# Patient Record
Sex: Male | Born: 1980 | Hispanic: No | Marital: Single | Smoking: Never smoker
Health system: Southern US, Community
[De-identification: ages and names within clinical notes are randomized; demographics above are authoritative.]

## PROBLEM LIST (undated history)

## (undated) HISTORY — PX: FRACTURE SURGERY: SHX138

---

## 2002-02-24 ENCOUNTER — Emergency Department (HOSPITAL_COMMUNITY): Admission: EM | Admit: 2002-02-24 | Discharge: 2002-02-24 | Payer: Self-pay | Admitting: Emergency Medicine

## 2006-02-05 ENCOUNTER — Emergency Department (HOSPITAL_COMMUNITY): Admission: EM | Admit: 2006-02-05 | Discharge: 2006-02-05 | Payer: Self-pay | Admitting: Emergency Medicine

## 2007-10-14 ENCOUNTER — Emergency Department (HOSPITAL_COMMUNITY): Admission: EM | Admit: 2007-10-14 | Discharge: 2007-10-14 | Payer: Self-pay | Admitting: Emergency Medicine

## 2008-03-18 ENCOUNTER — Emergency Department (HOSPITAL_COMMUNITY): Admission: EM | Admit: 2008-03-18 | Discharge: 2008-03-18 | Payer: Self-pay | Admitting: Emergency Medicine

## 2010-01-27 IMAGING — CT CT HEAD W/O CM
3 series · 16 of 47 positions shown, 19 images · IV contrast (agent unspecified)
Comparison: None
COMPARISON: None

CLINICAL DATA: Facial numbness.  Headache.  Left facial pain and
swelling.  Fever.

CT HEAD WITHOUT CONTRAST
TECHNIQUE: Contiguous axial images were obtained from the base of
the skull through the vertex without intravenous contrast.
CLINICAL DATA: Facial pain and swelling.
CT MAXILLOFACIAL WITH CONTRAST
TECHNIQUE: Multidetector CT imaging of the maxillofacial structure
s was performed with
intravenous contrast. Multiplanar CT image reconstructions were als
o generated.
Contrast:  100 ml [id number]

[Series 3: headseq 4.8 h45s · axial · 0.43mm/px · z∈[-177,-56]mm · 10 of 30 slices shown, 13 images]
[im 3/30  brain]
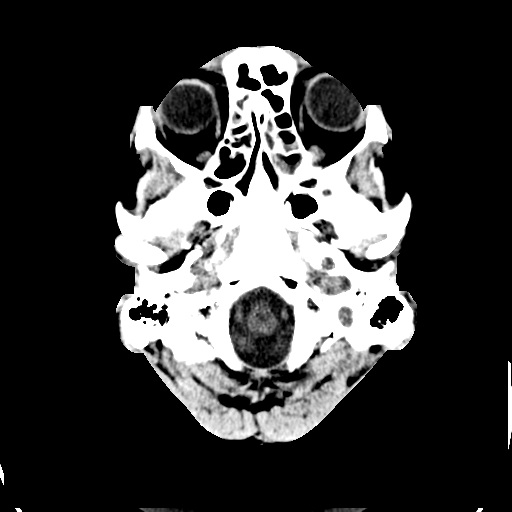
[im 3/30  bone]
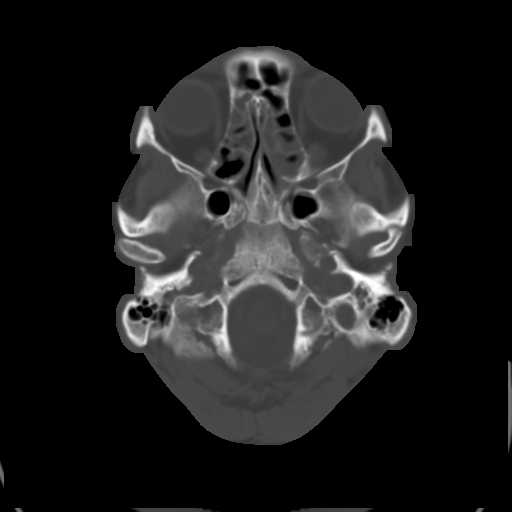
[im 6/30  brain]
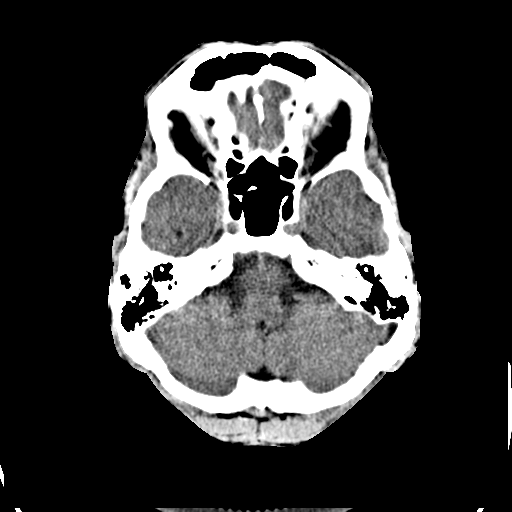
[im 9/30  brain]
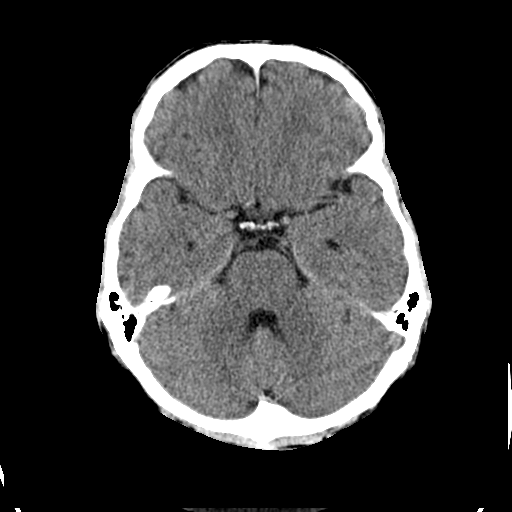
[im 11/30  brain]
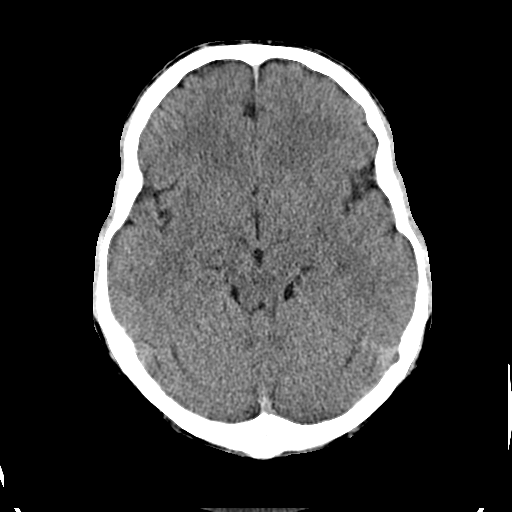
[im 14/30  brain]
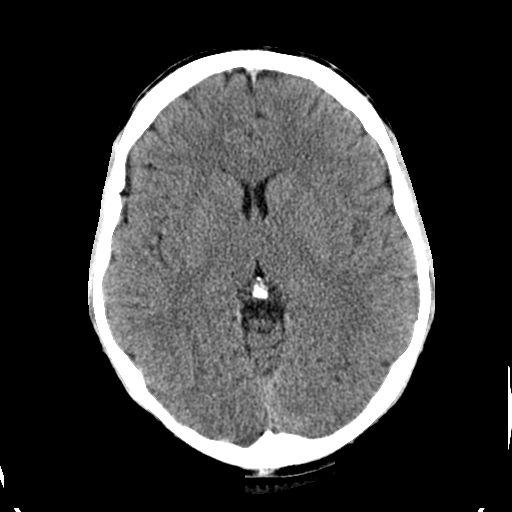
[im 14/30  bone]
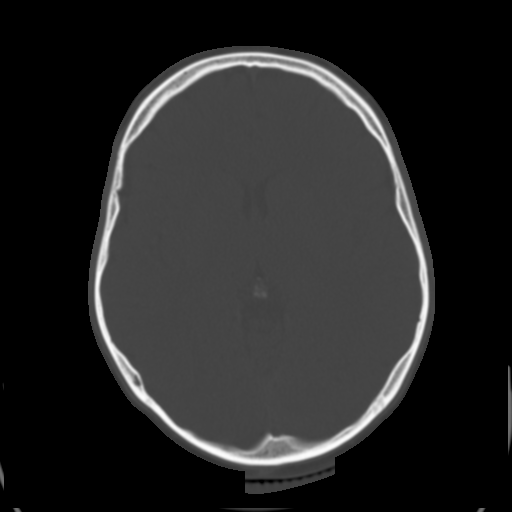
[im 17/30  brain]
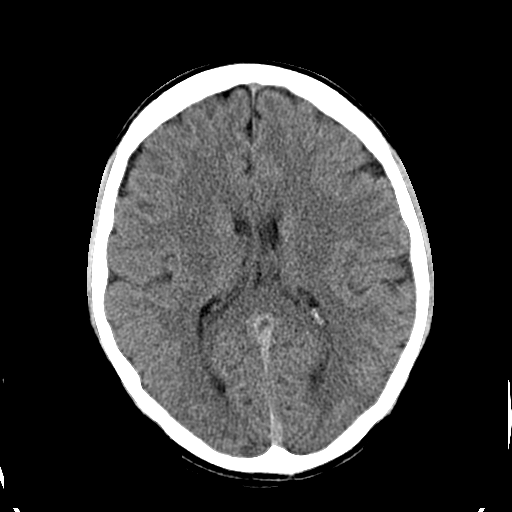
[im 20/30  brain]
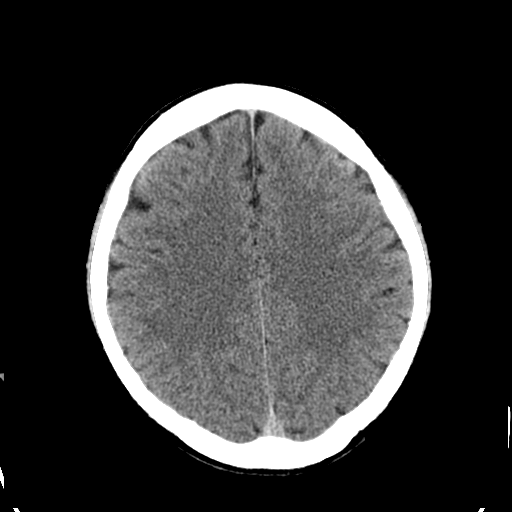
[im 23/30  brain]
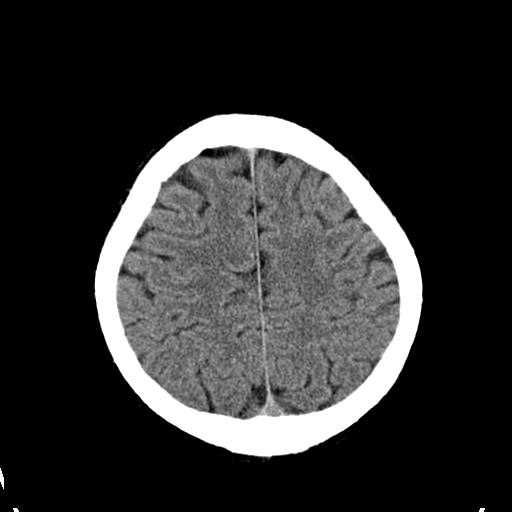
[im 25/30  brain]
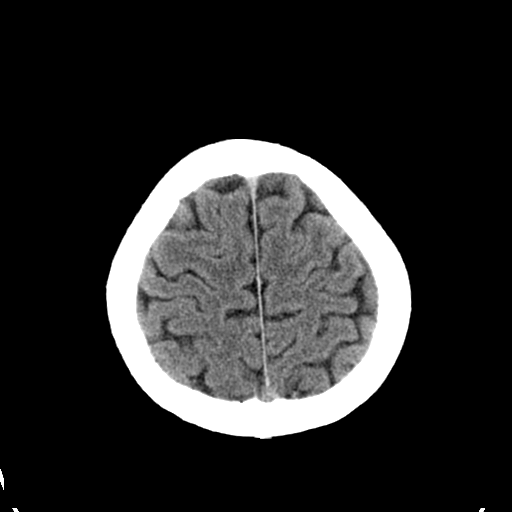
[im 25/30  bone]
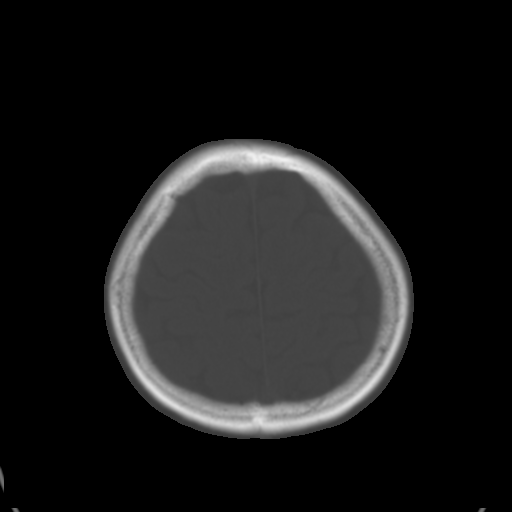
[im 28/30  brain]
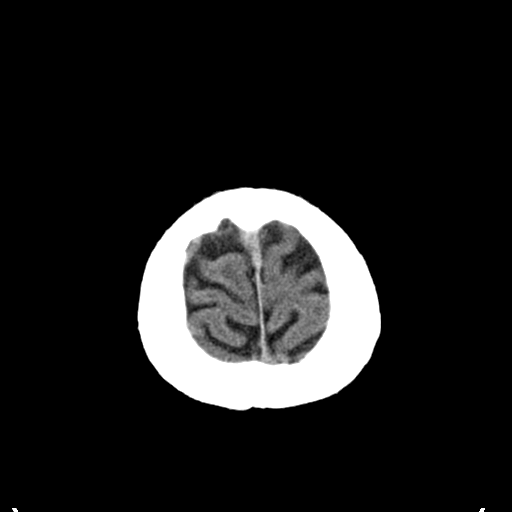

[Series 602: coronal images · coronal · 0.35mm/px · 3 of 70 slices shown]
[im 24/70  brain]
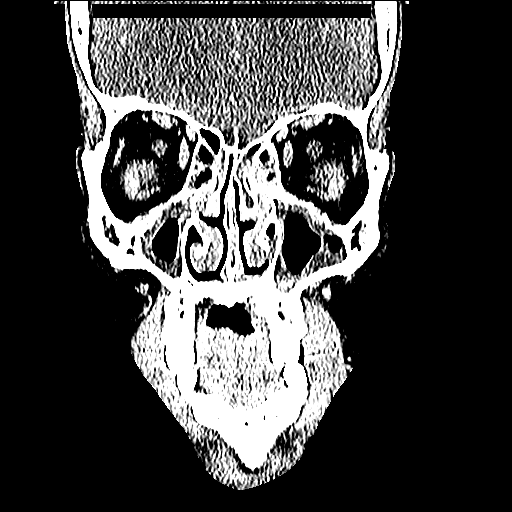
[im 31/70  brain]
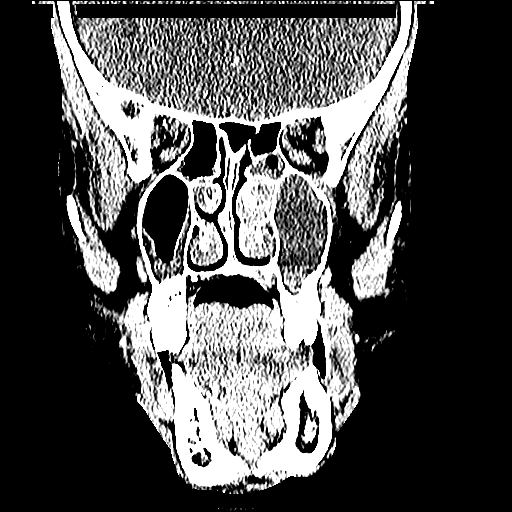
[im 39/70  brain]
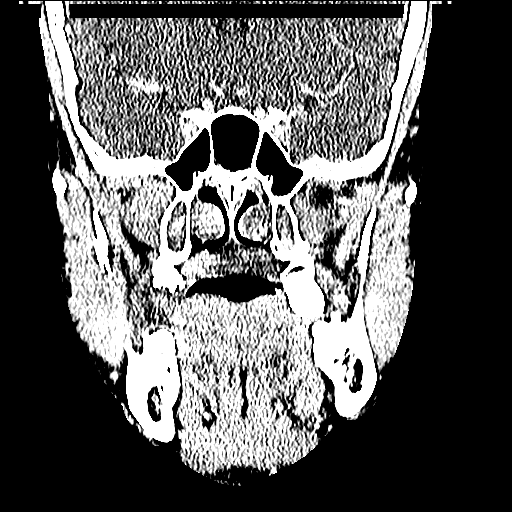

[Series 603: sagittal images · sagittal · 0.35mm/px · 3 of 76 slices shown]
[im 26/76  brain]
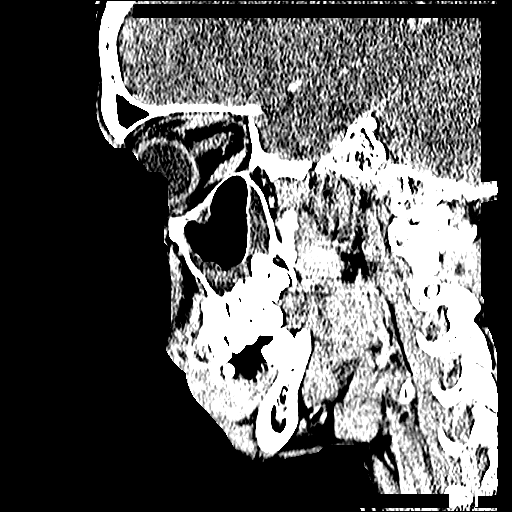
[im 38/76  brain]
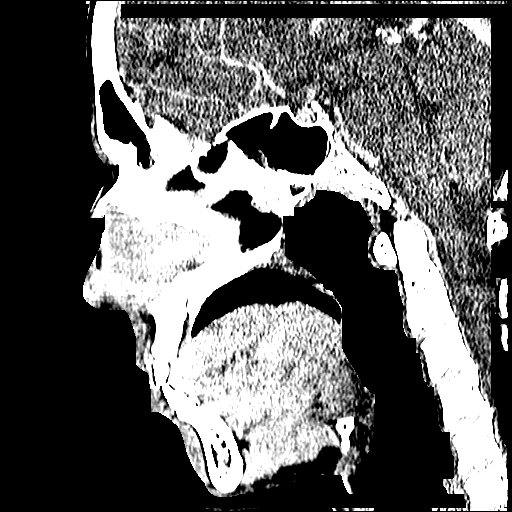
[im 51/76  brain]
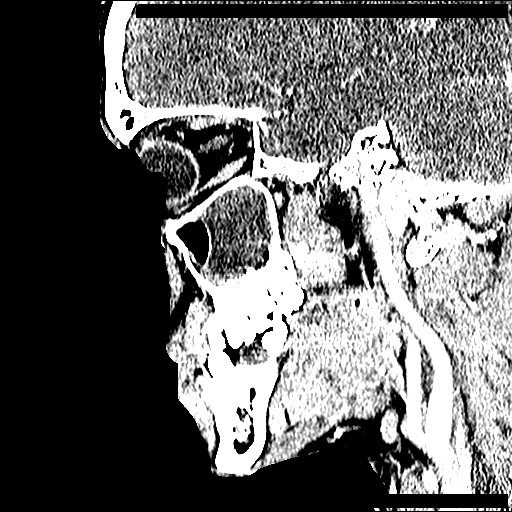

[16 of 47 positions shown; findings below may reference images not displayed]

FINDINGS: The ventricles are normal.  No extra-axial fluid
collections are seen.  The brainstem and cerebellum are
unremarkable.  No acute intracranial findings or mass lesions.

The bony calvarium is intact.  Ethmoid, maxillary and sphenoid
sinus disease.  The mastoid air cells and frontal sinuses are
clear.
IMPRESSION: No acute intracranial findings.
Bilateral sinus disease.
FINDINGS: There is a small amount of mucoperiosteal thickening and
fluid in the right frontal sinus.  There is bilateral ethmoid
sinus disease with areas of complete opacification and
mucoperiosteal thickening.  There is a small amount of fluid in the
left half of the sphenoid sinus along with areas of mucoperiosteal
thickening.  There is bilateral acute maxillary sinusitis with near
complete opacification and air fluid levels.  The mastoid air cells
and middle ear cavities are clear.

No worrisome areas of intracranial enhancement are seen.  There is
mild soft tissue thickening over the frontal scalp region.  No soft
tissue abscess.  The facial bones are intact.  The mandibular
condyles are normally located.  Scattered neck nodes but no
adenopathy.  The parotid and submandibular glands appear normal.
IMPRESSION: Pansinus disease.  The maxillary sinuses are most affected.

## 2010-06-06 LAB — URINALYSIS, ROUTINE W REFLEX MICROSCOPIC
Glucose, UA: NEGATIVE mg/dL
Hgb urine dipstick: NEGATIVE
Ketones, ur: NEGATIVE mg/dL
Protein, ur: NEGATIVE mg/dL
Specific Gravity, Urine: 1.015 (ref 1.005–1.030)

## 2010-06-06 LAB — DIFFERENTIAL
Basophils Absolute: 0 10*3/uL (ref 0.0–0.1)
Basophils Relative: 0 % (ref 0–1)
Lymphocytes Relative: 14 % (ref 12–46)
Lymphs Abs: 1.6 10*3/uL (ref 0.7–4.0)
Neutro Abs: 9.2 10*3/uL — ABNORMAL HIGH (ref 1.7–7.7)

## 2010-06-06 LAB — URINE CULTURE
Colony Count: NO GROWTH
Culture: NO GROWTH

## 2010-06-06 LAB — CBC
MCHC: 33.7 g/dL (ref 30.0–36.0)
WBC: 12 10*3/uL — ABNORMAL HIGH (ref 4.0–10.5)

## 2010-06-06 LAB — POCT I-STAT, CHEM 8
BUN: 7 mg/dL (ref 6–23)
Chloride: 104 mEq/L (ref 96–112)
Creatinine, Ser: 1 mg/dL (ref 0.4–1.5)
HCT: 40 % (ref 39.0–52.0)
Potassium: 3.5 mEq/L (ref 3.5–5.1)

## 2015-03-23 ENCOUNTER — Emergency Department (HOSPITAL_COMMUNITY)
Admission: EM | Admit: 2015-03-23 | Discharge: 2015-03-23 | Disposition: A | Discharge status: Home / Self Care | Payer: Self-pay | Attending: Emergency Medicine | Admitting: Emergency Medicine

## 2015-03-23 ENCOUNTER — Encounter (HOSPITAL_COMMUNITY): Payer: Self-pay | Admitting: Emergency Medicine

## 2015-03-23 ENCOUNTER — Emergency Department (HOSPITAL_COMMUNITY): Payer: Medicaid Other

## 2015-03-23 DIAGNOSIS — Z9889 Other specified postprocedural states: Secondary | ICD-10-CM | POA: Insufficient documentation

## 2015-03-23 DIAGNOSIS — M79631 Pain in right forearm: Secondary | ICD-10-CM | POA: Insufficient documentation

## 2015-03-23 DIAGNOSIS — M25521 Pain in right elbow: Secondary | ICD-10-CM | POA: Insufficient documentation

## 2015-03-23 DIAGNOSIS — Z8781 Personal history of (healed) traumatic fracture: Secondary | ICD-10-CM | POA: Insufficient documentation

## 2015-03-23 MED ORDER — HYDROCODONE-ACETAMINOPHEN 5-325 MG PO TABS: 2.0000 | ORAL_TABLET | ORAL | Status: DC | PRN

## 2015-03-23 MED ORDER — IBUPROFEN 400 MG PO TABS
800.0000 mg | ORAL_TABLET | Freq: Once | ORAL | Status: AC
  Administered 2015-03-23: 800 mg via ORAL
  Filled 2015-03-23: qty 2

## 2015-03-23 NOTE — ED Notes (Signed)
Hx of right radial head sx in 2011 by MD in [facility]. Started 1 week ago with pain right elbow. 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

## 2015-03-23 NOTE — ED Provider Notes (Signed)
History  By signing my name below, I, Karle Plumber, attest that this documentation has been prepared under the direction and in the presence of Melburn Hake, [place]. Electronically Signed: Karle Plumber, ED Scribe. 03/23/2015. 12:02 PM  Chief Complaint  Patient presents with  . Arm Pain   The history is provided by the patient and medical records. No language interpreter was used.    HPI Comments:  Omar Simon is a 35 y.o. male who presents to the Emergency Department complaining of sharp RUE pain that began about one week ago. He states about 5-6 years ago he had surgery to the right radial head by a doctor in [facility]. He states he used a Chiropodist at work and is concerned he may have disrupted some of the hardware in the arm. He states he can feel a "bump" on the right arm near the elbow that is new. Letting the arm hang by his side and moving the right arm increases the pain. Holding the arm in a protective bent state helps to alleviate the pain. He has been taking Tramadol for the pain with some relief. He denies swelling, redness, fever, chills, numbness or weakness of the RUE, right shoulder. He denies any new trauma, injury or fall. He denies allergies to any medications.  History reviewed. No pertinent past medical history. Past Surgical History  Procedure Laterality Date  . Fracture surgery     No family history on file. Social History  Substance Use Topics  . Smoking status: Never Smoker   . Smokeless tobacco: None  . Alcohol Use: No    Review of Systems  Constitutional: Negative for fever and chills.  Musculoskeletal: Positive for myalgias.  Skin: Negative for color change and wound.  Neurological: Negative for weakness and numbness.    Allergies  Review of patient's allergies indicates no known allergies.  Home Medications   Prior to Admission medications   Medication Sig Start Date End Date Taking? Authorizing Provider   HYDROcodone-acetaminophen (NORCO/VICODIN) 5-325 MG tablet Take 2 tablets by mouth every 4 (four) hours as needed. 03/23/15   Barrett Henle, PA-C  traMADol (ULTRAM) 50 MG tablet Take 50 mg by mouth every 6 (six) hours as needed.   Yes Historical Provider, MD   Triage Vitals: BP 126/81 mmHg  Pulse 65  Temp(Src) 98.4 F (36.9 C) (Oral)  Resp 18  SpO2 100% Physical Exam  Constitutional: He is oriented to person, place, and time. He appears well-developed and well-nourished.  HENT:  Head: Normocephalic and atraumatic.  Eyes: EOM are normal.  Neck: Normal range of motion.  Cardiovascular: Normal rate.   2+ radial pulse. Cap refill less than 2 seconds.  Pulmonary/Chest: Effort normal.  Musculoskeletal: Normal range of motion.  Mild tenderness over right lateral epicondyle and radial head. No swelling, erythema or warmth. Full ROM with flexion and extension of right elbow with reported pain. Decreased supination of right forearm which pt reports is unchanged s/p surgery. Full ROM of right hand, wrist and shoulder.  Neurological: He is alert and oriented to person, place, and time.  Sensations intact. Grip strength equal bilaterally.  Skin: Skin is warm and dry.  Psychiatric: He has a normal mood and affect. His behavior is normal.  Nursing note and vitals reviewed.   ED Course  Procedures (including critical care time) DIAGNOSTIC STUDIES: Oxygen Saturation is 100% on RA, normal by my interpretation.   COORDINATION OF CARE: 11:21 AM- Will X-Ray right elbow and order pain medication. Pt  verbalizes understanding and agrees to plan.  Medications  ibuprofen (ADVIL,MOTRIN) tablet 800 mg (800 mg Oral Given 03/23/15 1132)    Labs Review Labs Reviewed - No data to display  Imaging Review Dg Elbow Complete Right  03/23/2015  CLINICAL DATA:  Painful right elbow for 1 week.  Lateral elbow pain. EXAM: RIGHT ELBOW - COMPLETE 3+ VIEW COMPARISON:  None. FINDINGS: Patient is status post  joint replacement involving the radial head. There is lucency around the prosthesis which could reflect loosening. No joint effusion. No fracture, subluxation or dislocation. IMPRESSION: Lucency around the radial head prosthesis could reflect loosening. Electronically Signed   By: Charlett Nose M.D.   On: 03/23/2015 11:58   I have personally reviewed and evaluated these images and lab results as part of my medical decision-making.  Filed Vitals:   03/23/15 1059 03/23/15 1223  BP: 126/81 125/78  Pulse: 65 67  Temp: 98.4 F (36.9 C) 98.6 F (37 C)  Resp: 18 18     MDM   Final diagnoses:  Right elbow pain    Patient presents with right elbow pain. Denies any known fall, trauma, injury but reports using a heavy hammer drill at work this week. Endorses history of surgery to right elbow due to fracture. VSS. Exam revealed mild tenderness over right lateral condyle and radial head, right arm otherwise neurovascularly intact. Right elbow x-ray revealed lucency around radial head prosthesis possibly reflecting loosening. Plan to discharge patient home with pain meds and symptomatic treatment. Patient given information for orthopedic follow-up.  Evaluation does not show pathology requring ongoing emergent intervention or admission. Pt is hemodynamically stable and mentating appropriately. Discussed findings/results and plan with patient/guardian, who agrees with plan. All questions answered. Return precautions discussed and outpatient follow up given.    I personally performed the services described in this documentation, which was scribed in my presence. The recorded information has been reviewed and is accurate.     Satira Sark [place], [place] 03/23/15 1545  Tilden Fossa, MD 03/24/15 [phone]

## 2015-03-23 NOTE — Discharge Instructions (Signed)
Take your medications as prescribed. I also recommend continuing to rest your elbow and applying ice for 15-20 minutes 3-4 times daily as needed for pain relief. Call the orthopedist office listed above to schedule a follow-up appointment. Return to the emergency department if symptoms worsen or new onset of fever, redness, swelling, numbness, tingling, weakness, decreased range of motion.   Emergency Department Resource Guide 1) Find a Doctor and Pay Out of Pocket Although you won't have to find out who is covered by your insurance plan, it is a good idea to ask around and get recommendations. You will then need to call the office and see if the doctor you have chosen will accept you as a new patient and what types of options they offer for patients who are self-pay. Some doctors offer discounts or will set up payment plans for their patients who do not have insurance, but you will need to ask so you aren't surprised when you get to your appointment.  2) Contact Your Local Health Department Not all health departments have doctors that can see patients for sick visits, but many do, so it is worth a call to see if yours does. If you don't know where your local health department is, you can check in your phone book. The CDC also has a tool to help you locate your state's health department, and many state websites also have listings of all of their local health departments.  3) Find a Walk-in Clinic If your illness is not likely to be very severe or complicated, you may want to try a walk in clinic. These are popping up all over the country in pharmacies, drugstores, and shopping centers. They're usually staffed by nurse practitioners or physician assistants that have been trained to treat common illnesses and complaints. They're usually fairly quick and inexpensive. However, if you have serious medical issues or chronic medical problems, these are probably not your best option.  No Primary Care  Doctor: - Call Health Connect at  [phone] - they can help you locate a primary care doctor that  accepts your insurance, provides certain services, etc. - Physician Referral Service- [phone]  Chronic Pain Problems: Organization         Address  Phone   Notes  [facility]  [phone] Patients need to be referred by their primary care doctor.   Medication Assistance: Organization         Address  Phone   Notes  [facility] Medication [facility] [address] [place] [place], [place] [address] [phone] --Must be a resident of [facility] -- Must have NO insurance coverage whatsoever (no Medicaid/ Medicare, etc.) -- The pt. MUST have a primary care doctor that directs their care regularly and follows them in the community   [facility]ist  [phone]   [employer]  [phone]    Agencies that provide inexpensive medical care: Organization         Address  Phone   Notes  [facility]  [phone]   [facility]    [phone]   [facility] [address] [place], [place] [address] [phone]   [facility] [address] [place]. [address], [place] [phone]   Planned Parenthood    [phone]   [facility]    [phone]   Community Health and [facility]  [address] [place], [place] Phone:  [phone])  [id number], Fax:  [phone] Hours of Operation:  9 am - 6 pm, M-F.  Also accepts Medicaid/Medicare and self-pay.  [facility] for [place] [address], [address], [place] Phone: [phone], Fax: [phone]. Hours of Operation:  8:30 am - 5:30 pm, M-F.  Also accepts Medicaid and self-pay.  [facility] [place] [address], [place] Phone: [phone]   [place], [place], [place] [phone], Ext. [phone] Mondays & Thursdays: 7-9 AM.  First 15 patients are seen on a first  come, first serve basis.    [place] Providers:  Organization         Address  Phone   Notes  [facility] [place] [address], [place] [phone] Also accepts self-pay patients.  [facility] [id number] [place], [place]  [phone]   [place], [address], [place] [phone]   [facility] [address], [place] [phone]   Lucianne Lei [address], [place]   [phone] Only accepts [place] Access [place] patients after they have their name applied to their card.   Self-Pay (no insurance) in [facility]:  Organization         Address  Phone   Notes  Sickle Cell Patients, [facility] [place] [phone]   [facility] [place] [phone]   [facility] [place]  [place], [place], [place] [phone]   [facility]/Dr. Osei-Bonsu  [address] [place] or [address], [place] [phone] Phone number for both [place] and [place] locations is the same.  [facility] [place] [address], [place] [phone]   [place] [address], [place] or [address] [phone]   [facility] [address], [place] [phone], phone; [phone], fax Sees patients 1st and 3rd Saturday of every month.  Must not qualify for public or private insurance (i.e. Medicaid, Medicare, [place] Health Choice, Veterans' Benefits)  Household income should be no more than 200% of the poverty level The clinic cannot treat you if you are pregnant or think you are pregnant  Sexually transmitted diseases are not treated at the clinic.    Dental Care: Organization          Address  Phone  Notes  [facility] of [facility] [phone] Accepts children up to age 20 who are enrolled in [place] or [place]; pregnant women with a Medicaid card; and children who have applied for Medicaid or [place] Health Choice, but were declined, whose parents can pay a reduced fee at time of service.  [facility] Department of [facility]  [address], [place] [phone] Accepts children up to age 65 who are enrolled in [place] or [place]; pregnant women with a Medicaid card; and children who have applied for Medicaid or [place] Health Choice, but were declined, whose parents can pay a reduced fee at time of service.  [facility] PRO[facility]  [place] [phone] Patients are seen by appointment only. Walk-ins are  not accepted. [place] will see patients 46 years of age and older. Monday - Tuesday (8am-5pm) Most Wednesdays (8:30-5pm) $30 per visit, cash only  [facility] Adult Dental Access PROGRAM  [address], [facility] [phone] Patients are seen by appointment only. Walk-ins are not accepted. [place] will see patients 60 years of age and older. One Wednesday Evening (Monthly: Volunteer Based).  $30 per visit, cash only  [place]  [phone] for adults; Children under age 9, call Graduate Pediatric Dentistry at [phone]. Children aged 38-14, please call [phone] to request a pediatric application.  Dental services are provided in all areas of dental care including fillings, crowns and bridges, complete and partial dentures, implants, gum treatment, root canals, and extractions. Preventive care is also provided. Treatment is provided to both adults and children. Patients are selected via a lottery and there is often a waiting list.   [facility] [address] [place]  [phone] [email or web address]   [facility] [address] [place], [place] [phone], Ext. [phone] Second and Fourth Thursday of each month, opens at 6:30 AM; Clinic ends at 9 AM.  Patients are seen on a first-come first-served basis, and a limited number are seen during each clinic.   [facility]  [address] [place], [place] [phone]   Eligibility Requirements You must have lived in [place], [place], or [place] counties for at least the last three months.   You cannot be eligible for state or federal sponsored [employer], including [employer], [place], or [employer].   You generally cannot be eligible for healthcare insurance through your employer.    How to apply: Eligibility screenings are held every Tuesday and Wednesday afternoon from 1:00 pm until 4:00 pm. You do not need an appointment for the interview!  [facility] [address], [place], [place]   [place]  [place] Department  [place]  [phone]    Behavioral Health Resources in the Community: Intensive Outpatient Programs Organization         Address  Phone  Notes  [place]. [address] [place], [place] [phone]   [facility] Outpatient [address], [place], [place]   ADS: Alcohol & Drug Svcs [address], [place], [place]   [place] [address],  [place], [place] or [phone]   Substance Abuse Resources Organization         Address  Phone  Notes  Alcohol and Drug Services  [phone]   [place]  [phone]   [facility] [place]   Chinita Pester  ([phone]   Residential & Outpatient Substance Abuse Program  [phone]   Psychological  Services Organization         Address  Phone  Notes  [facility] [place]  [place]  [phone]   [place] [address], [place] [phone] or [phone]    Mobile Crisis Teams Organization         Address  Phone  Notes  [facility], Mobile Crisis Care Unit  [phone]   [facility] Psychotherapeutic Services  [address] [place], [place]   [facility] [address] [place] [phone]    Self-Help/Support  Groups Organization         Address  Phone             Notes  [facility]ental [facility]oc[facility] of [place] - variety of support groups  336- I7437963 Call for more information  Narcotics Anonymous (NA), [facility] [address], [employer] [place]  2 meetings at this location   Statistician         Address  Phone  Notes  [facility] [address],    [place]  [phone]   [facility]  [address], [place] [address], [place], [place] [phone]   [facility] Treatment [facility] [address] [place], [place] [phone] Admissions: 8am-3pm M-F  [facility] [address],    [place], [place] [phone]   [facility] [address] [place], [place], [place] [phone]   [facility] [address],  [place], [place] [phone]   [facility] - Intensive Outpatient [address], [place], [place] [phone]   [facility] [address]  [place], [place] [phone] or [phone]   [facility] ([facility]) [address] [place], [place] [phone] Accepts Medicaid  [facility] [address]  [place] [phone] Substance Abuse/Addiction Treatment   [facility] Organization         Address  Phone  Notes  [facility]  [phone]   Angie Fava, PhD [address] [place]vin Knack [place], [place]   ([phone] or [phone]   [facility]   [address] [place], [place] [phone]   [facility] [address] [place], [place] [phone] Insurance/Medicaid/sponsorship through [facility] and Families [address]                                    [place], [place] [phone] Therapy/tele-psych/case  [facility] [address] [place], [place] [phone]    Dr. Lolly Mustache  [phone]   [facility]  [employer] [facility] Dept. 1) [address] [place] 2) [address], [place] 3)  [address], [place] [phone]  [phone]   [facility] Child Abuse Hotline 915-182-6104 or [phone] (After Hours)

## 2017-12-22 LAB — GLUCOSE, POCT (MANUAL RESULT ENTRY): POC Glucose: 147 mg/dl — AB (ref 70–99)

## 2019-12-21 ENCOUNTER — Other Ambulatory Visit: Payer: Self-pay

## 2019-12-21 ENCOUNTER — Emergency Department (HOSPITAL_COMMUNITY)
Admission: EM | Admit: 2019-12-21 | Discharge: 2019-12-22 | Disposition: A | Discharge status: Home / Self Care | Payer: Self-pay | Attending: Emergency Medicine | Admitting: Emergency Medicine

## 2019-12-21 ENCOUNTER — Encounter (HOSPITAL_COMMUNITY): Payer: Self-pay | Admitting: *Deleted

## 2019-12-21 DIAGNOSIS — T1501XA Foreign body in cornea, right eye, initial encounter: Secondary | ICD-10-CM | POA: Insufficient documentation

## 2019-12-21 DIAGNOSIS — M25521 Pain in right elbow: Secondary | ICD-10-CM | POA: Insufficient documentation

## 2019-12-21 DIAGNOSIS — X58XXXA Exposure to other specified factors, initial encounter: Secondary | ICD-10-CM | POA: Insufficient documentation

## 2019-12-21 DIAGNOSIS — T1591XA Foreign body on external eye, part unspecified, right eye, initial encounter: Secondary | ICD-10-CM

## 2019-12-21 NOTE — ED Triage Notes (Signed)
The pt  Was welding approx 2 hours ago some particle from the drill he was using went into his rt eye,  He also twisted his rt arm with the drill c/o pain

## 2019-12-22 ENCOUNTER — Emergency Department (HOSPITAL_COMMUNITY): Payer: Self-pay

## 2019-12-22 MED ORDER — OXYCODONE-ACETAMINOPHEN 5-325 MG PO TABS
2.0000 | ORAL_TABLET | Freq: Once | ORAL | Status: AC
  Administered 2019-12-22: 2 via ORAL
  Filled 2019-12-22: qty 2

## 2019-12-22 MED ORDER — FLUORESCEIN SODIUM 1 MG OP STRP
1.0000 | ORAL_STRIP | Freq: Once | OPHTHALMIC | Status: AC
  Administered 2019-12-22: 1 via OPHTHALMIC
  Filled 2019-12-22: qty 1

## 2019-12-22 MED ORDER — TETRACAINE HCL 0.5 % OP SOLN
2.0000 [drp] | Freq: Once | OPHTHALMIC | Status: AC
  Administered 2019-12-22: 2 [drp] via OPHTHALMIC
  Filled 2019-12-22: qty 4

## 2019-12-22 MED ORDER — ERYTHROMYCIN 5 MG/GM OP OINT
1.0000 "application " | TOPICAL_OINTMENT | Freq: Once | OPHTHALMIC | Status: AC
  Administered 2019-12-22: 1 via OPHTHALMIC
  Filled 2019-12-22: qty 3.5

## 2019-12-22 MED ORDER — KETOROLAC TROMETHAMINE 0.5 % OP SOLN
1.0000 [drp] | Freq: Once | OPHTHALMIC | Status: AC
  Administered 2019-12-22: 1 [drp] via OPHTHALMIC
  Filled 2019-12-22 (×2): qty 5
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
## 2019-12-22 NOTE — Discharge Instructions (Signed)
Use the ointment and drops I gave you every four hours until you follow up with ophthalmologist  Use ice/ace wrap on right elbow with sling for comfort until you can follow up with your orthopedist. If you don't have one, refer to number above to get appointment.

## 2019-12-22 NOTE — Progress Notes (Signed)
Orthopedic Tech Progress Note Patient Details:  Omar Simon [birth year] [id number]  Ortho Devices Type of Ortho Device: Arm sling Ortho Device/Splint Location: rue. applied at drs request Ortho Device/Splint Interventions: Ordered, Application, Adjustment   Post Interventions Patient Tolerated: Well Instructions Provided: Care of device, Adjustment of device   Trinna Post 12/22/2019, 3:34 AM

## 2019-12-22 NOTE — ED Notes (Signed)
Eye pad applied to right eye.

## 2021-08-26 ENCOUNTER — Other Ambulatory Visit: Payer: Self-pay

## 2021-08-26 ENCOUNTER — Emergency Department (HOSPITAL_COMMUNITY): Payer: Self-pay

## 2021-08-26 ENCOUNTER — Emergency Department (HOSPITAL_COMMUNITY)
Admission: EM | Admit: 2021-08-26 | Discharge: 2021-08-27 | Disposition: A | Discharge status: Home / Self Care | Payer: Self-pay | Attending: Emergency Medicine | Admitting: Emergency Medicine

## 2021-08-26 ENCOUNTER — Encounter (HOSPITAL_COMMUNITY): Payer: Self-pay | Admitting: Emergency Medicine

## 2021-08-26 DIAGNOSIS — K2100 Gastro-esophageal reflux disease with esophagitis, without bleeding: Secondary | ICD-10-CM | POA: Insufficient documentation

## 2021-08-26 DIAGNOSIS — M79641 Pain in right hand: Secondary | ICD-10-CM | POA: Insufficient documentation

## 2021-08-26 LAB — COMPREHENSIVE METABOLIC PANEL
ALT: 19 U/L (ref 0–44)
AST: 19 U/L (ref 15–41)
Albumin: 4.1 g/dL (ref 3.5–5.0)
Alkaline Phosphatase: 72 U/L (ref 38–126)
Anion gap: 11 (ref 5–15)
BUN: 12 mg/dL (ref 6–20)
CO2: 25 mmol/L (ref 22–32)
Calcium: 9.5 mg/dL (ref 8.9–10.3)
Chloride: 102 mmol/L (ref 98–111)
Creatinine, Ser: 1.07 mg/dL (ref 0.61–1.24)
GFR, Estimated: >60 mL/min (ref >60)
Glucose, Bld: 164 mg/dL — ABNORMAL HIGH (ref 70–99)
Potassium: 3.6 mmol/L (ref 3.5–5.1)
Sodium: 138 mmol/L (ref 135–145)
Total Bilirubin: 0.4 mg/dL (ref 0.3–1.2)
Total Protein: 7.3 g/dL (ref 6.5–8.1)

## 2021-08-26 LAB — CBC WITH DIFFERENTIAL/PLATELET
Abs Immature Granulocytes: 0.02 10*3/uL (ref 0.00–0.07)
Basophils Absolute: 0 10*3/uL (ref 0.0–0.1)
Basophils Relative: 1 %
Eosinophils Absolute: 0.1 10*3/uL (ref 0.0–0.5)
Eosinophils Relative: 2 %
HCT: 40.8 % (ref 39.0–52.0)
Hemoglobin: 13.6 g/dL (ref 13.0–17.0)
Immature Granulocytes: 0 %
Lymphocytes Relative: 26 %
Lymphs Abs: 2 10*3/uL (ref 0.7–4.0)
MCH: 28.7 pg (ref 26.0–34.0)
MCHC: 33.3 g/dL (ref 30.0–36.0)
MCV: 86.1 fL (ref 80.0–100.0)
Monocytes Absolute: 0.5 10*3/uL (ref 0.1–1.0)
Monocytes Relative: 6 %
Neutro Abs: 5 10*3/uL (ref 1.7–7.7)
Neutrophils Relative %: 65 %
Platelets: 282 10*3/uL (ref 150–400)
RBC: 4.74 MIL/uL (ref 4.22–5.81)
RDW: 12.2 % (ref 11.5–15.5)
WBC: 7.7 10*3/uL (ref 4.0–10.5)
nRBC: 0 % (ref 0.0–0.2)

## 2021-08-26 LAB — LIPASE, BLOOD: Lipase: 48 U/L (ref 11–51)
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
## 2021-08-26 NOTE — ED Notes (Signed)
Pt now c/o heartburn and is requesting labs.
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
## 2021-08-27 NOTE — Discharge Instructions (Signed)
Please schedule an appointment with you primary care physician to further evaluate your reflux.
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
## 2023-09-06 ENCOUNTER — Other Ambulatory Visit: Payer: Self-pay

## 2023-09-06 ENCOUNTER — Encounter (HOSPITAL_COMMUNITY): Payer: Self-pay

## 2023-09-06 ENCOUNTER — Emergency Department (HOSPITAL_COMMUNITY): Payer: Self-pay

## 2023-09-06 ENCOUNTER — Emergency Department (HOSPITAL_COMMUNITY)
Admission: EM | Admit: 2023-09-06 | Discharge: 2023-09-06 | Disposition: A | Discharge status: Home / Self Care | Payer: Self-pay | Attending: Student | Admitting: Student

## 2023-09-06 DIAGNOSIS — M25522 Pain in left elbow: Secondary | ICD-10-CM | POA: Insufficient documentation

## 2023-09-06 DIAGNOSIS — W1830XA Fall on same level, unspecified, initial encounter: Secondary | ICD-10-CM | POA: Insufficient documentation

## 2023-09-06 DIAGNOSIS — M25571 Pain in right ankle and joints of right foot: Secondary | ICD-10-CM | POA: Insufficient documentation

## 2023-09-06 MED ORDER — MELOXICAM 15 MG PO TABS: 15.0000 mg | ORAL_TABLET | Freq: Every day | ORAL | 0 refills | Status: AC

## 2023-09-06 NOTE — ED Provider Notes (Signed)
 [facility] EMERGENCY DEPARTMENT AT [facility] Provider Note  CSN: [id number] Arrival date & time: 09/06/23 9243  Chief Complaint(s) Elbow Pain, Ankle Pain, and Fall  HPI Omar Simon is a 43 y.o. male who presents emerged part for evaluation of a fall.  Patient states that he suffered a fall yesterday rolling his right ankle and striking his left elbow against a table.  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  States that his symptoms have been persistent over the last 24 hours and comes the emergency department to rule out fracture.  Denies any additional traumatic implants including chest pain, shortness of breath, abdominal pain, headache, numbness, tingling, weakness or other systemic complaints.   Past Medical History History reviewed. No pertinent past medical history. There are no active problems to display for this patient.  Home Medication(s) Prior to Admission medications   Medication Sig Start Date End Date Taking? Authorizing Provider  meloxicam  (MOBIC ) 15 MG tablet Take 1 tablet (15 mg total) by mouth daily. 09/06/23 10/06/23 Yes Aphrodite Harpenau, MD  HYDROcodone -acetaminophen  (NORCO/VICODIN) 5-325 MG tablet Take 2 tablets by mouth every 4 (four) hours as needed. 03/23/15   Nevelyn Nat Norris, PA-C  traMADol (ULTRAM) 50 MG tablet Take 50 mg by mouth every 6 (six) hours as needed.    [provider]                                                                                                                                    Past Surgical History Past Surgical History:  Procedure Laterality Date   FRACTURE SURGERY     Family History History reviewed. No pertinent family history.  Social History Social History   Tobacco Use   Smoking status: Never   Smokeless tobacco: Never  Substance Use Topics   Alcohol use: No   Drug use: No   Allergies Pollen extract  Review of Systems Review of Systems  Musculoskeletal:   Positive for arthralgias, joint swelling and myalgias.    Physical Exam Vital Signs  I have reviewed the triage vital signs BP (!) 116/91 (BP Location: Right Arm)   Pulse (!) 47   Temp 98.2 F (36.8 C)   Resp 19   Ht 5' 11 (1.803 m)   Wt 63.5 kg   SpO2 100%   BMI 19.53 kg/m   Physical Exam Vitals and nursing note reviewed.  Constitutional:      General: He is not in acute distress.    Appearance: He is well-developed.  HENT:     Head: Normocephalic and atraumatic.  Eyes:     Conjunctiva/sclera: Conjunctivae normal.  Cardiovascular:     Rate and Rhythm: Normal rate and regular rhythm.     Heart sounds: No murmur heard. Pulmonary:     Effort: Pulmonary effort is normal. No respiratory distress.     Breath sounds: Normal breath sounds.  Abdominal:  Palpations: Abdomen is soft.     Tenderness: There is no abdominal tenderness.  Musculoskeletal:        General: Tenderness present. No swelling.     Cervical back: Neck supple.  Skin:    General: Skin is warm and dry.     Capillary Refill: Capillary refill takes less than 2 seconds.  Neurological:     Mental Status: He is alert.  Psychiatric:        Mood and Affect: Mood normal.     ED Results and Treatments Labs (all labs ordered are listed, but only abnormal results are displayed) Labs Reviewed - No data to display                                                                                                                        Radiology CT ANKLE RIGHT WO CONTRAST Result Date: 09/06/2023 CLINICAL DATA:  Fall, ankle injury, irregular anterior process of the calcaneus on conventional radiography for further investigation. EXAM: CT OF THE RIGHT ANKLE WITHOUT CONTRAST TECHNIQUE: Multidetector CT imaging of the right ankle was performed according to the standard protocol. Multiplanar CT image reconstructions were also generated. RADIATION DOSE REDUCTION: This exam was performed according to the departmental  dose-optimization program which includes automated exposure control, adjustment of the mA and/or kV according to patient size and/or use of iterative reconstruction technique. COMPARISON:  Radiographs 09/06/2023 FINDINGS: Bones/Joint/Cartilage The apparent irregularity of the anterior process of the calcaneus on radiography was caused by the presence of an os calcaneus secondarius, a normal variant accessory ossicle along the tip of the anterior process of the calcaneus, as shown on image 54 series 8. No fracture or acute bony findings identified in this vicinity or elsewhere in the ankle. Note is made of an os peroneus. No tibiotalar joint effusion. Ligaments Suboptimally assessed by CT. Potential mild thickening of the superomedial portion of the spring ligament. Muscles and Tendons Mild flexor hallucis longus tenosynovitis just proximal to the knot of Henry. Potential mild peroneus peritendinitis adjacent to the lateral malleolus. Soft tissues Unremarkable IMPRESSION: 1. The apparent irregularity of the anterior process of the calcaneus on radiography was caused by the presence of an os calcaneus secondarius, a normal variant accessory ossicle along the tip of the anterior process of the calcaneus. No fracture or acute bony findings identified in this vicinity or elsewhere in the ankle. 2. Mild flexor hallucis longus tenosynovitis just proximal to the knot of Henry. 3. Potential mild peroneus peritendinitis adjacent to the lateral malleolus. 4. Potential mild thickening of the superomedial portion of the spring ligament. Electronically Signed   By: Ryan Salvage M.D.   On: 09/06/2023 12:35   DG Elbow Complete Left Result Date: 09/06/2023 CLINICAL DATA:  Fall, left elbow pain EXAM: LEFT ELBOW - COMPLETE 3+ VIEW COMPARISON:  None FINDINGS: No fracture or joint effusion identified. Normal alignment. No significant soft tissue abnormality observed. IMPRESSION: 1. No fracture or joint effusion identified.  Electronically Signed   By:  Ryan Salvage M.D.   On: 09/06/2023 09:42   DG Ankle Complete Right Result Date: 09/06/2023 CLINICAL DATA:  Fall, right ankle injury EXAM: RIGHT ANKLE - COMPLETE 3+ VIEW COMPARISON:  None Available. FINDINGS: Potentially irregularity of the anterior process of the calcaneus on the lateral projection could be from bony superimposition but I cannot exclude a small fracture. CT of the ankle is recommended for definitive assessment. The malleoli, plafond, and talar dome appear intact. IMPRESSION: 1. Potentially irregularity of the anterior process of the calcaneus on the lateral projection could be from bony superimposition but I cannot exclude a small fracture. CT of the ankle is recommended for definitive assessment. Electronically Signed   By: Ryan Salvage M.D.   On: 09/06/2023 09:41    Pertinent labs & imaging results that were available during my care of the patient were reviewed by me and considered in my medical decision making (see MDM for details).  Medications Ordered in ED Medications - No data to display                                                                                                                                   Procedures Procedures  (including critical care time)  Medical Decision Making / ED Course   This patient presents to the ED for concern of fall, this involves an extensive number of treatment options, and is a complaint that carries with it a high risk of complications and morbidity.  The differential diagnosis includes fracture, hematoma, contusion, ligamentous injury, sprain  MDM: Seen emergency room for evaluation of elbow and ankle pain after a fall.  Physical exam with mild swelling and tenderness to the right ankle, tenderness at the dorsal elbow on the left.  X-ray of the elbow is reassuringly negative.  X-ray of the ankle showing a possible anterior calcaneal fracture but follow-up CT does not show evidence of a  fracture.  Does show evidence of tendinitis and tenosynovitis.  Patient will be started on a Mobic  regimen but at this time he does not meet inpatient criteria for admission and will be discharged with outpatient follow-up.   Additional history obtained:  -External records from outside source obtained and reviewed including: Chart review including previous notes, labs, imaging, consultation notes      Imaging Studies ordered: I ordered imaging studies including x-ray elbow, x-ray ankle, CT ankle I independently visualized and interpreted imaging. I agree with the radiologist interpretation   Medicines ordered and prescription drug management: Meds ordered this encounter  Medications   meloxicam  (MOBIC ) 15 MG tablet    Sig: Take 1 tablet (15 mg total) by mouth daily.    Dispense:  30 tablet    Refill:  0    -I have reviewed the patients home medicines and have made adjustments as needed  Critical interventions none  Social Determinants of Health:  Factors impacting patients care include: none   Reevaluation:  After the interventions noted above, I reevaluated the patient and found that they have :stayed the same  Co morbidities that complicate the patient evaluation History reviewed. No pertinent past medical history.    Dispostion: I considered admission for this patient, but at this time he does not meet inpatient criteria for admission and will be discharged outpatient follow-up     Final Clinical Impression(s) / ED Diagnoses Final diagnoses:  Left elbow pain  Acute right ankle pain     @PCDICTATION @    Albertina Dixon, MD 09/06/23 [phone]

## 2023-09-06 NOTE — ED Triage Notes (Signed)
 Complains of left elbow pain.  Reports unsure what he did but hurts with lifting his arm and moving arm.  Denies shoulder pain chest pain.

## 2023-09-06 NOTE — ED Triage Notes (Signed)
 Pt. Stated, I fell, tripped, and hurt my left elbow and right ankle.

## 2024-02-22 ENCOUNTER — Encounter (HOSPITAL_COMMUNITY): Payer: Self-pay

## 2024-02-22 ENCOUNTER — Ambulatory Visit (HOSPITAL_COMMUNITY)
Admission: EM | Admit: 2024-02-22 | Discharge: 2024-02-22 | Disposition: A | Discharge status: Home / Self Care | Payer: Self-pay | Attending: Emergency Medicine | Admitting: Emergency Medicine

## 2024-02-22 ENCOUNTER — Ambulatory Visit (HOSPITAL_COMMUNITY): Payer: Self-pay

## 2024-02-22 DIAGNOSIS — K5900 Constipation, unspecified: Secondary | ICD-10-CM

## 2024-02-22 DIAGNOSIS — R1013 Epigastric pain: Secondary | ICD-10-CM

## 2024-02-22 MED ORDER — ALUM & MAG HYDROXIDE-SIMETH 200-200-20 MG/5ML PO SUSP
ORAL | Status: AC
  Filled 2024-02-22: qty 30

## 2024-02-22 MED ORDER — BISACODYL 10 MG RE SUPP: 10.0000 mg | RECTAL | 0 refills | Status: DC | PRN

## 2024-02-22 MED ORDER — ALUM & MAG HYDROXIDE-SIMETH 200-200-20 MG/5ML PO SUSP
30.0000 mL | Freq: Once | ORAL | Status: AC
  Administered 2024-02-22: 30 mL via ORAL

## 2024-02-22 MED ORDER — POLYETHYLENE GLYCOL 3350 17 G PO PACK: 17.0000 g | PACK | Freq: Every day | ORAL | 0 refills | Status: DC

## 2024-02-22 NOTE — ED Triage Notes (Signed)
 Pt has c/o a bump in the middle chest of chest since yesterday. Pt has also has c/o heartburn that started yesterday. Pt has been having complaints of pain in the tailbone x 2 months.

## 2024-02-22 NOTE — ED Provider Notes (Signed)
 " [facility]    CSN: [id number] Arrival date & time: 02/22/24  1745      History   Chief Complaint Chief Complaint  Patient presents with   Heartburn   Cyst    HPI Omar Simon is a 44 y.o. male.   Patient presents with epigastric pain, indigestion, and reflux that began yesterday.  Patient states that at times it feels like he has a to his epigastric region and he feels very bloated.  Patient states that he has been having some issues with constipation and states that he did take magnesium citrate yesterday and was able to have a bowel movement, but states that he feels like he is still constipated despite this.    Patient denies any diarrhea, blood in stool, vomiting, chest pain, and shortness of breath.  Patient denies any history of abdominal surgeries or diagnosed abdominal issues.  Patient also endorses mild tailbone pain that has been going on for approximately 2 months.  Patient reports this pain is intermittent.  Patient states that he thinks it may be the way that he sits while he is driving.  Patient denies any known falls or injuries.  Patient also denies any swelling or redness to this area.  Patient denies taking any medication for his pain.  The history is provided by the patient and medical records.  Heartburn    History reviewed. No pertinent past medical history.  There are no active problems to display for this patient.   Past Surgical History:  Procedure Laterality Date   FRACTURE SURGERY         Home Medications    Prior to Admission medications  Medication Sig Start Date End Date Taking? Authorizing Provider  bisacodyl (DULCOLAX) 10 MG suppository Place 1 suppository (10 mg total) rectally as needed for moderate constipation. 02/22/24  Yes Johnie, Giara Mcgaughey A, NP  polyethylene glycol (MIRALAX) 17 g packet Take 17 g by mouth daily. 02/22/24  Yes Johnie, Annajulia Lewing A, NP  traMADol (ULTRAM) 50 MG tablet Take 50 mg by mouth every 6 (six)  hours as needed.    [provider]    Family History History reviewed. No pertinent family history.  Social History Social History[1]   Allergies   Pollen extract   Review of Systems Review of Systems  Gastrointestinal:  Positive for heartburn.   Per HPI  Physical Exam Triage Vital Signs ED Triage Vitals  Encounter Vitals Group     BP 02/22/24 2049 (!) 137/92     Girls Systolic BP Percentile --      Girls Diastolic BP Percentile --      Boys Systolic BP Percentile --      Boys Diastolic BP Percentile --      Pulse Rate 02/22/24 2049 62     Resp 02/22/24 2049 16     Temp 02/22/24 2049 98.5 F (36.9 C)     Temp Source 02/22/24 2049 Oral     SpO2 02/22/24 2049 99 %     Weight --      Height --      Head Circumference --      Peak Flow --      Pain Score 02/22/24 2046 4     Pain Loc --      Pain Education --      Exclude from Growth Chart --    No data found.  Updated Vital Signs BP (!) 137/92 (BP Location: Right Arm)   Pulse 62  Temp 98.5 F (36.9 C) (Oral)   Resp 16   SpO2 99%   Visual Acuity Right Eye Distance:   Left Eye Distance:   Bilateral Distance:    Right Eye Near:   Left Eye Near:    Bilateral Near:     Physical Exam Vitals and nursing note reviewed.  Constitutional:      General: He is awake. He is not in acute distress.    Appearance: Normal appearance. He is well-developed and well-groomed. He is not ill-appearing.  Cardiovascular:     Rate and Rhythm: Normal rate and regular rhythm.  Pulmonary:     Effort: Pulmonary effort is normal.     Breath sounds: Normal breath sounds.  Chest:     Chest wall: No mass, swelling, tenderness or edema.  Abdominal:     General: Abdomen is flat. Bowel sounds are normal. There is no distension.     Palpations: Abdomen is soft. There is no mass.     Tenderness: There is abdominal tenderness in the epigastric area.     Hernia: No hernia is present.  Musculoskeletal:       Back:      Comments: Mild tenderness noted to coccyx.  Without any swelling, bruising, or erythema present.  Skin:    General: Skin is warm and dry.  Neurological:     General: No focal deficit present.     Mental Status: He is alert and oriented to person, place, and time. Mental status is at baseline.  Psychiatric:        Behavior: Behavior is cooperative.      UC Treatments / Results  Labs (all labs ordered are listed, but only abnormal results are displayed) Labs Reviewed - No data to display  EKG   Radiology No results found.  Procedures Procedures (including critical care time)  Medications Ordered in UC Medications  alum & mag hydroxide-simeth (MAALOX/MYLANTA) 200-200-20 MG/5ML suspension 30 mL (30 mLs Oral Given 02/22/24 2111)    Initial Impression / Assessment and Plan / UC Course  I have reviewed the triage vital signs and the nursing notes.  Pertinent labs & imaging results that were available during my care of the patient were reviewed by me and considered in my medical decision making (see chart for details).     Patient is overall well-appearing but does appear to be in some discomfort.  Given Maalox in clinic with minimal relief.  Abdominal x-ray ordered.  I independently interpreted these images and there appears to be a large amount of stool burden and gas present.  Recommended MiraLAX and Dulcolax suppository to help with significant constipation.  Discussed importance of hydration to help with constipation as well.  Discussed follow-up, return, and strict ER precautions. Final Clinical Impressions(s) / UC Diagnoses   Final diagnoses:  Epigastric pain  Constipation, unspecified constipation type     Discharge Instructions      Your X-ray does reveal a significant amount of constipation. Take 2 capfuls of Miralax mixed in water once a day. Make sure you are drinking lots of water when taking this for it to be the most effective. Also use dulcolax suppository  once daily for additional relief of constipation. Avoid eating any red foods or drinking any red liquids so that this does not confuse with the blood in the stool. If you have vomiting, severe abdominal pain, still unable to have a bowel movement, notice blood in vomit or stool please go to the emergency department for  further evaluation.     ED Prescriptions     Medication Sig Dispense Auth. Provider   polyethylene glycol (MIRALAX) 17 g packet Take 17 g by mouth daily. 14 each Johnie Flaming A, NP   bisacodyl (DULCOLAX) 10 MG suppository Place 1 suppository (10 mg total) rectally as needed for moderate constipation. 6 suppository Johnie Flaming A, NP      PDMP not reviewed this encounter.    [1]  Social History Tobacco Use   Smoking status: Never   Smokeless tobacco: Never  Substance Use Topics   Alcohol use: No   Drug use: No     Johnie Flaming LABOR, NP 02/22/24 2145  "

## 2024-02-22 NOTE — Discharge Instructions (Addendum)
 Your X-ray does reveal a significant amount of constipation. Take 2 capfuls of Miralax mixed in water once a day. Make sure you are drinking lots of water when taking this for it to be the most effective. Also use dulcolax suppository once daily for additional relief of constipation. Avoid eating any red foods or drinking any red liquids so that this does not confuse with the blood in the stool. If you have vomiting, severe abdominal pain, still unable to have a bowel movement, notice blood in vomit or stool please go to the emergency department for further evaluation.

## 2024-02-25 ENCOUNTER — Ambulatory Visit (HOSPITAL_COMMUNITY): Payer: Self-pay

## 2024-03-25 ENCOUNTER — Other Ambulatory Visit (HOSPITAL_BASED_OUTPATIENT_CLINIC_OR_DEPARTMENT_OTHER): Payer: Self-pay

## 2024-03-25 ENCOUNTER — Encounter (HOSPITAL_BASED_OUTPATIENT_CLINIC_OR_DEPARTMENT_OTHER): Payer: Self-pay

## 2024-03-25 ENCOUNTER — Ambulatory Visit (HOSPITAL_BASED_OUTPATIENT_CLINIC_OR_DEPARTMENT_OTHER)
Admission: EM | Admit: 2024-03-25 | Discharge: 2024-03-25 | Disposition: A | Discharge status: Home / Self Care | Payer: Self-pay | Source: Home / Self Care

## 2024-03-25 DIAGNOSIS — L282 Other prurigo: Secondary | ICD-10-CM

## 2024-03-25 DIAGNOSIS — L509 Urticaria, unspecified: Secondary | ICD-10-CM

## 2024-03-25 MED ORDER — PREDNISONE 20 MG PO TABS
ORAL_TABLET | ORAL | 0 refills | Status: AC
  Filled 2024-03-25: qty 9, 6d supply, fill #0

## 2024-03-25 MED ORDER — CETIRIZINE HCL 10 MG PO TABS
10.0000 mg | ORAL_TABLET | Freq: Two times a day (BID) | ORAL | 0 refills | Status: AC | PRN
  Filled 2024-03-25: qty 60, 30d supply, fill #0

## 2024-03-25 NOTE — Discharge Instructions (Addendum)
 Urticaria of unknown origin with pruritic rash: Prednisone  20 mg, #2 pills (40 mg dose) daily x 3 days, then take 20 mg, #1 pill (20 mg dose) daily x 3 days.  Cetirizine  10 mg every 12 hours if needed for rash or itching.  Oatmeal lukewarm baths will help the rash and itching some.  May use OTC hydrocortisone cream once or twice daily but limit to 3 weeks maximum duration or less.  May need to see dermatology if symptoms persist.  Follow-up here as needed.

## 2024-03-25 NOTE — ED Provider Notes (Signed)
 " [facility]    CSN: [id number] Arrival date & time: 03/25/24  1107      History   Chief Complaint Chief Complaint  Patient presents with   Pruritis   Blurred Vision    HPI Omar Simon is a 44 y.o. male.   44 year old male with complaint of acute onset rash, itching and even some swelling.  The rash first appeared on his arms.  The rash started on 03/24/2024.  As the day went on he noticed it on his face and neck.  He has a little bit of itching in his groin but he does not know if this is related or if he is spreading something by scratching.  He is not on any medications except for tramadol and he has been taking that for years for chronic pain.  He is not aware of any new foods or other exposures.  He thought this might be poison ivy because he has been out in his yard and driveway using a shovel to get rid of snow but he has not been aware of being near poison ivy.     History reviewed. No pertinent past medical history.  There are no active problems to display for this patient.   Past Surgical History:  Procedure Laterality Date   FRACTURE SURGERY         Home Medications    Prior to Admission medications  Medication Sig Start Date End Date Taking? Authorizing Provider  cetirizine  (ZYRTEC ) 10 MG tablet Take 1 tablet (10 mg total) by mouth every 12 (twelve) hours as needed for allergies (rash). 03/25/24  Yes Ival Domino, FNP  predniSONE  (DELTASONE ) 20 MG tablet Take 2 tablets by mouth daily x 3 days, then 1 tablet by mouth daily x 3 days. 03/25/24  Yes Ival Domino, FNP  traMADol (ULTRAM) 50 MG tablet Take 50 mg by mouth every 6 (six) hours as needed.    [provider]    Family History History reviewed. No pertinent family history.  Social History Social History[1]   Allergies   Pollen extract   Review of Systems Review of Systems  Constitutional:  Negative for fever.  Respiratory:  Negative for cough.   Cardiovascular:   Negative for chest pain.  Gastrointestinal:  Negative for abdominal pain, constipation, diarrhea, nausea and vomiting.  Musculoskeletal:  Negative for arthralgias and back pain.  Skin:  Positive for rash (On the face, in the ears, on the neck and on the arms.  The rash is red and itches.). Negative for color change.  Neurological:  Negative for syncope.  All other systems reviewed and are negative.    Physical Exam Triage Vital Signs ED Triage Vitals  Encounter Vitals Group     BP 03/25/24 1126 128/88     Girls Systolic BP Percentile --      Girls Diastolic BP Percentile --      Boys Systolic BP Percentile --      Boys Diastolic BP Percentile --      Pulse Rate 03/25/24 1126 70     Resp 03/25/24 1126 16     Temp 03/25/24 1126 98.4 F (36.9 C)     Temp Source 03/25/24 1126 Oral     SpO2 03/25/24 1126 96 %     Weight --      Height --      Head Circumference --      Peak Flow --      Pain Score 03/25/24 1123 0  Pain Loc --      Pain Education --      Exclude from Growth Chart --    No data found.  Updated Vital Signs BP 128/88 (BP Location: Right Arm)   Pulse 70   Temp 98.4 F (36.9 C) (Oral)   Resp 16   SpO2 96%   Visual Acuity Right Eye Distance: 20/50 Left Eye Distance: 20/13-2 Bilateral Distance: 20/13-2  Right Eye Near:   Left Eye Near:    Bilateral Near:     Physical Exam Vitals and nursing note reviewed.  Constitutional:      General: He is not in acute distress.    Appearance: He is well-developed. He is not ill-appearing or toxic-appearing.  HENT:     Head: Normocephalic and atraumatic.     Right Ear: Hearing, tympanic membrane, ear canal and external ear normal.     Left Ear: Hearing, tympanic membrane, ear canal and external ear normal.     Nose: No congestion or rhinorrhea.     Right Sinus: No maxillary sinus tenderness or frontal sinus tenderness.     Left Sinus: No maxillary sinus tenderness or frontal sinus tenderness.     Mouth/Throat:      Lips: Pink.     Mouth: Mucous membranes are moist.     Pharynx: Uvula midline. No oropharyngeal exudate or posterior oropharyngeal erythema.     Tonsils: No tonsillar exudate.  Eyes:     Conjunctiva/sclera: Conjunctivae normal.     Pupils: Pupils are equal, round, and reactive to light.  Cardiovascular:     Rate and Rhythm: Normal rate and regular rhythm.     Heart sounds: S1 normal and S2 normal. No murmur heard. Pulmonary:     Effort: Pulmonary effort is normal. No respiratory distress.     Breath sounds: Normal breath sounds. No decreased breath sounds, wheezing, rhonchi or rales.  Abdominal:     General: Bowel sounds are normal.     Palpations: Abdomen is soft.     Tenderness: There is no abdominal tenderness.  Musculoskeletal:        General: No swelling.     Cervical back: Neck supple.  Lymphadenopathy:     Head:     Right side of head: No submental, submandibular, tonsillar, preauricular or posterior auricular adenopathy.     Left side of head: No submental, submandibular, tonsillar, preauricular or posterior auricular adenopathy.     Cervical: No cervical adenopathy.     Right cervical: No superficial cervical adenopathy.    Left cervical: No superficial cervical adenopathy.  Skin:    General: Skin is warm and dry.     Capillary Refill: Capillary refill takes less than 2 seconds.     Findings: Rash (Erythematous rash with whelps and wheals on the right arm, the neck, the face, the forehead, the ears.  See photos for more information.) present.  Neurological:     Mental Status: He is alert and oriented to person, place, and time.  Psychiatric:        Mood and Affect: Mood normal.           UC Treatments / Results  Labs (all labs ordered are listed, but only abnormal results are displayed) Labs Reviewed - No data to display  EKG   Radiology No results found.  Procedures Procedures (including critical care time)  Medications Ordered in  UC Medications - No data to display  Initial Impression / Assessment and Plan / UC Course  I have reviewed the triage vital signs and the nursing notes.  Pertinent labs & imaging results that were available during my care of the patient were reviewed by me and considered in my medical decision making (see chart for details).  Plan of Care (see discharge instructions for additional patient precautions and education): Urticaria of unknown origin with pruritic rash: Prednisone  20 mg, #2 pills (40 mg dose) daily x 3 days, then take 20 mg, #1 pill (20 mg dose) daily x 3 days.  Cetirizine  10 mg every 12 hours if needed for rash or itching.  Oatmeal lukewarm baths will help the rash and itching some.  May use OTC hydrocortisone cream once or twice daily but limit to 3 weeks maximum duration or less.  May need to see dermatology if symptoms persist.  Follow-up here as needed.  I reviewed the plan of care with the patient and/or the patient's guardian.  The patient and/or guardian had time to ask questions and acknowledged that the questions were answered.  Final Clinical Impressions(s) / UC Diagnoses   Final diagnoses:  Urticaria of unknown origin  Pruritic rash     Discharge Instructions      Urticaria of unknown origin with pruritic rash: Prednisone  20 mg, #2 pills (40 mg dose) daily x 3 days, then take 20 mg, #1 pill (20 mg dose) daily x 3 days.  Cetirizine  10 mg every 12 hours if needed for rash or itching.  Oatmeal lukewarm baths will help the rash and itching some.  May use OTC hydrocortisone cream once or twice daily but limit to 3 weeks maximum duration or less.  May need to see dermatology if symptoms persist.  Follow-up here as needed.     ED Prescriptions     Medication Sig Dispense Auth. Provider   cetirizine  (ZYRTEC ) 10 MG tablet Take 1 tablet (10 mg total) by mouth every 12 (twelve) hours as needed for allergies (rash). 60 tablet Daphyne Miguez, FNP   predniSONE  (DELTASONE )  20 MG tablet Take 2 tablets by mouth daily x 3 days, then 1 tablet by mouth daily x 3 days. 9 tablet Islam Eichinger, FNP      PDMP not reviewed this encounter.    [1]  Social History Tobacco Use   Smoking status: Never   Smokeless tobacco: Never  Substance Use Topics   Alcohol use: No   Drug use: No     Ival Domino, FNP 03/25/24 1146  "
